# Patient Record
Sex: Female | Born: 1935 | Hispanic: No | Marital: Married | Smoking: Never smoker
Health system: Southern US, Community
[De-identification: ages and names within clinical notes are randomized; demographics above are authoritative.]

## PROBLEM LIST (undated history)

## (undated) DIAGNOSIS — E079 Disorder of thyroid, unspecified: Secondary | ICD-10-CM

## (undated) HISTORY — PX: THYROIDECTOMY, PARTIAL: SHX18

---

## 2003-04-25 ENCOUNTER — Other Ambulatory Visit: Admission: RE | Admit: 2003-04-25 | Discharge: 2003-04-25 | Payer: Self-pay | Admitting: Family Medicine

## 2014-07-16 ENCOUNTER — Emergency Department (HOSPITAL_COMMUNITY): Payer: Medicare Other

## 2014-07-16 ENCOUNTER — Emergency Department (HOSPITAL_COMMUNITY)
Admission: EM | Admit: 2014-07-16 | Discharge: 2014-07-16 | Disposition: A | Discharge status: Home / Self Care | Payer: Medicare Other | Attending: Emergency Medicine | Admitting: Emergency Medicine

## 2014-07-16 ENCOUNTER — Encounter (HOSPITAL_COMMUNITY): Payer: Self-pay | Admitting: Emergency Medicine

## 2014-07-16 DIAGNOSIS — W19XXXA Unspecified fall, initial encounter: Secondary | ICD-10-CM

## 2014-07-16 DIAGNOSIS — S0993XA Unspecified injury of face, initial encounter: Secondary | ICD-10-CM

## 2014-07-16 DIAGNOSIS — Z880 Allergy status to penicillin: Secondary | ICD-10-CM | POA: Diagnosis not present

## 2014-07-16 DIAGNOSIS — S80212A Abrasion, left knee, initial encounter: Secondary | ICD-10-CM | POA: Diagnosis not present

## 2014-07-16 DIAGNOSIS — Z79899 Other long term (current) drug therapy: Secondary | ICD-10-CM | POA: Insufficient documentation

## 2014-07-16 DIAGNOSIS — Y9389 Activity, other specified: Secondary | ICD-10-CM | POA: Diagnosis not present

## 2014-07-16 DIAGNOSIS — Y998 Other external cause status: Secondary | ICD-10-CM | POA: Insufficient documentation

## 2014-07-16 DIAGNOSIS — E079 Disorder of thyroid, unspecified: Secondary | ICD-10-CM | POA: Insufficient documentation

## 2014-07-16 DIAGNOSIS — S01511A Laceration without foreign body of lip, initial encounter: Secondary | ICD-10-CM | POA: Diagnosis not present

## 2014-07-16 DIAGNOSIS — W010XXA Fall on same level from slipping, tripping and stumbling without subsequent striking against object, initial encounter: Secondary | ICD-10-CM | POA: Insufficient documentation

## 2014-07-16 DIAGNOSIS — Y92093 Driveway of other non-institutional residence as the place of occurrence of the external cause: Secondary | ICD-10-CM | POA: Diagnosis not present

## 2014-07-16 HISTORY — DX: Disorder of thyroid, unspecified: E07.9

## 2014-07-16 MED ORDER — BACITRACIN 500 UNIT/GM EX OINT
1.0000 "application " | TOPICAL_OINTMENT | Freq: Two times a day (BID) | CUTANEOUS | Status: DC
  Filled 2014-07-16: qty 0.9

## 2014-07-16 NOTE — Discharge Instructions (Signed)
Contusion Apply Ice to swelling. Take tylenol for pain.  A contusion is a deep bruise. Contusions are the result of an injury that caused bleeding under the skin. The contusion may turn blue, purple, or yellow. Minor injuries will give you a painless contusion, but more severe contusions may stay painful and swollen for a few weeks.  CAUSES  A contusion is usually caused by a blow, trauma, or direct force to an area of the body. SYMPTOMS   Swelling and redness of the injured area.  Bruising of the injured area.  Tenderness and soreness of the injured area.  Pain. DIAGNOSIS  The diagnosis can be made by taking a history and physical exam. An X-ray, CT scan, or MRI may be needed to determine if there were any associated injuries, such as fractures. TREATMENT  Specific treatment will depend on what area of the body was injured. In general, the best treatment for a contusion is resting, icing, elevating, and applying cold compresses to the injured area. Over-the-counter medicines may also be recommended for pain control. Ask your caregiver what the best treatment is for your contusion. HOME CARE INSTRUCTIONS   Put ice on the injured area.  Put ice in a plastic bag.  Place a towel between your skin and the bag.  Leave the ice on for 15-20 minutes, 3-4 times a day, or as directed by your health care provider.  Only take over-the-counter or prescription medicines for pain, discomfort, or fever as directed by your caregiver. Your caregiver may recommend avoiding anti-inflammatory medicines (aspirin, ibuprofen, and naproxen) for 48 hours because these medicines may increase bruising.  Rest the injured area.  If possible, elevate the injured area to reduce swelling. SEEK IMMEDIATE MEDICAL CARE IF:   You have increased bruising or swelling.  You have pain that is getting worse.  Your swelling or pain is not relieved with medicines. MAKE SURE YOU:   Understand these  instructions.  Will watch your condition.  Will get help right away if you are not doing well or get worse. Document Released: 12/15/2004 Document Revised: 03/12/2013 Document Reviewed: 01/10/2011 [facility] Patient Information 2015 [place], [place]. This information is not intended to replace advice given to you by your health care provider. Make sure you discuss any questions you have with your health care provider.

## 2014-07-16 NOTE — ED Provider Notes (Signed)
CSN: [id number]     Arrival date & time 07/16/14  1434 History   First MD Initiated Contact with Patient 07/16/14 1617     Chief Complaint  Patient presents with  . Fall     (Consider location/radiation/quality/duration/timing/severity/associated sxs/prior Treatment) Patient is a 79 y.o. female presenting with fall. The history is provided by the patient. No language interpreter was used.  Fall Pertinent negatives include no neck pain.  Ms. Terri Ballard is a 79 y.o female with a history of thyroid disease who presents after trip and fall in the driveway 1 hour ago. She states the driveway is uneven.  She landed on the left side of her face. She denied any loss of consciousness.  She was able to get up and ambulate. She states she is in no pain but she was worried because her face is swollen.  She has had no treatment prior to arrival.  She denies being on aspirin or blood thinners.  She denies a headache, vision changes, chest pain, shortness of breath, abdominal pain, or extremity pain.   Past Medical History  Diagnosis Date  . Thyroid disease    History reviewed. No pertinent past surgical history. History reviewed. No pertinent family history. History  Substance Use Topics  . Smoking status: Never Smoker   . Smokeless tobacco: Not on file  . Alcohol Use: No   OB History    No data available     Review of Systems  HENT: Positive for facial swelling.   Eyes: Negative for photophobia, redness and visual disturbance.  Musculoskeletal: Negative for neck pain.  Neurological: Negative for syncope.      Allergies  Ciprofloxacin; Doxycycline; Keflex; and Penicillins  Home Medications   Prior to Admission medications   Medication Sig Start Date End Date Taking? Authorizing Provider  Calcium Carbonate-Vitamin D (CALCIUM + D PO) Take 1 tablet by mouth daily.   Yes Historical Provider, MD  SYNTHROID 50 MCG tablet Take 50 mcg by mouth every morning. 05/11/14  Yes Historical Provider,  MD   BP 123/79 mmHg  Pulse 71  Temp(Src) 98.2 F (36.8 C) (Oral)  Resp 16  SpO2 98% Physical Exam  Constitutional: She is oriented to person, place, and time. She appears well-developed and well-nourished.  HENT:  Head: Normocephalic.  Significant edema and ecchymosis below the left eye.   Small 1/2cm laceration below the left sided lower vermillion border.   She has ecchymosis of the lower lip.   No broken teeth or dental pain.   Eyes: Conjunctivae are normal.  Neck: Normal range of motion. Neck supple.  Cardiovascular: Normal rate, regular rhythm and normal heart sounds.   Pulmonary/Chest: Effort normal and breath sounds normal.  Abdominal: Soft. There is no tenderness.  Musculoskeletal: Normal range of motion.  Left knee abrasion. Full ROM.  Patient ambulating while I am speaking with her.   Neurological: She is alert and oriented to person, place, and time.  Skin: Skin is warm and dry.  Nursing note and vitals reviewed.   ED Course  Procedures (including critical care time) Labs Review Labs Reviewed - No data to display  Imaging Review Ct Maxillofacial Wo Cm  07/16/2014   CLINICAL DATA:  Fall today. Multiple facial abrasions. Left zygoma region swelling.  EXAM: CT MAXILLOFACIAL WITHOUT CONTRAST  TECHNIQUE: Multidetector CT imaging of the maxillofacial structures was performed. Multiplanar CT image reconstructions were also generated. A small metallic BB was placed on the right temple in order to reliably differentiate right from  left.  COMPARISON:  Head CT, 07/19/2007  FINDINGS: No fracture. Sinuses are clear. Soft tissue attenuation lies the right mastoid bone, stable from the prior CT. There changes from a left mastoidectomy, also stable.  There is left cheek soft tissue edema and with a small hematoma.  Normal globes and orbits. Structures of the skullbase are unremarkable. No soft tissue masses or adenopathy.  IMPRESSION: 1. No fracture. 2. Left cheek soft tissue edema and  small hematoma. No other acute finding.   Electronically Signed   By: Amie Portland M.D.   On: 07/16/2014 17:10     EKG Interpretation None      MDM   Final diagnoses:  Fall, initial encounter  Facial injury, initial encounter  Patient presents for trip and fall in the driveway injuring the left side of her face.  He has no headache, no vision changes, no loss of consciousness.  She appears stable and is pacing in the ED with her husband. Her vitals are not concerning.   CT maxillofacial is negative for a fracture and the orbits appear normal.  She does have some soft tissue edema and a small hematoma. I have given her follow up.  She is to apply ice for swelling and I did tell her that she would most likely have a black eye in a few hours. She cane take tylenol for pain.  I also gave her bacitracin to apply below the lip and on the left knee abrasion.       Catha Gosselin, PA-C 07/16/14 1747  Nelva Nay, MD 07/17/14 [phone]

## 2014-07-16 NOTE — ED Notes (Signed)
Pt tripped and fell earlier today. Complains of L side facial, elbow and knee pain. Abrasions noted to side of face, elbow and knee. Full ROM. Not on blood thinners, no LOC.

## 2018-04-19 ENCOUNTER — Encounter (HOSPITAL_COMMUNITY): Payer: Self-pay

## 2018-04-19 ENCOUNTER — Emergency Department (HOSPITAL_COMMUNITY): Payer: Medicare Other

## 2018-04-19 ENCOUNTER — Emergency Department (HOSPITAL_COMMUNITY)
Admission: EM | Admit: 2018-04-19 | Discharge: 2018-04-19 | Disposition: A | Discharge status: Home / Self Care | Payer: Medicare Other | Attending: Emergency Medicine | Admitting: Emergency Medicine

## 2018-04-19 ENCOUNTER — Other Ambulatory Visit: Payer: Self-pay

## 2018-04-19 DIAGNOSIS — S42291A Other displaced fracture of upper end of right humerus, initial encounter for closed fracture: Secondary | ICD-10-CM | POA: Diagnosis not present

## 2018-04-19 DIAGNOSIS — Y929 Unspecified place or not applicable: Secondary | ICD-10-CM | POA: Diagnosis not present

## 2018-04-19 DIAGNOSIS — S43084A Other dislocation of right shoulder joint, initial encounter: Secondary | ICD-10-CM | POA: Diagnosis not present

## 2018-04-19 DIAGNOSIS — S4991XA Unspecified injury of right shoulder and upper arm, initial encounter: Secondary | ICD-10-CM | POA: Diagnosis present

## 2018-04-19 DIAGNOSIS — W010XXA Fall on same level from slipping, tripping and stumbling without subsequent striking against object, initial encounter: Secondary | ICD-10-CM | POA: Diagnosis not present

## 2018-04-19 DIAGNOSIS — Z79899 Other long term (current) drug therapy: Secondary | ICD-10-CM | POA: Diagnosis not present

## 2018-04-19 DIAGNOSIS — Y999 Unspecified external cause status: Secondary | ICD-10-CM | POA: Diagnosis not present

## 2018-04-19 DIAGNOSIS — Y939 Activity, unspecified: Secondary | ICD-10-CM | POA: Diagnosis not present

## 2018-04-19 DIAGNOSIS — S43004A Unspecified dislocation of right shoulder joint, initial encounter: Secondary | ICD-10-CM

## 2018-04-19 MED ORDER — SODIUM CHLORIDE 0.9 % IV BOLUS
1000.0000 mL | Freq: Once | INTRAVENOUS | Status: AC
  Administered 2018-04-19: 1000 mL via INTRAVENOUS

## 2018-04-19 MED ORDER — FENTANYL CITRATE (PF) 100 MCG/2ML IJ SOLN
50.0000 ug | Freq: Once | INTRAMUSCULAR | Status: AC
  Administered 2018-04-19: 50 ug via INTRAVENOUS
  Filled 2018-04-19: qty 2

## 2018-04-19 MED ORDER — PROPOFOL 10 MG/ML IV BOLUS
0.5000 mg/kg | Freq: Once | INTRAVENOUS | Status: AC
  Administered 2018-04-19: 29.1 mg via INTRAVENOUS
  Filled 2018-04-19: qty 20

## 2018-04-19 MED ORDER — ONDANSETRON 4 MG PO TBDP
4.0000 mg | ORAL_TABLET | Freq: Once | ORAL | Status: DC | PRN
  Filled 2018-04-19 (×2): qty 1

## 2018-04-19 MED ORDER — IBUPROFEN 200 MG PO TABS
400.0000 mg | ORAL_TABLET | Freq: Once | ORAL | Status: AC
  Administered 2018-04-19: 400 mg via ORAL
  Filled 2018-04-19: qty 2

## 2018-04-19 MED ORDER — PROPOFOL 10 MG/ML IV BOLUS
INTRAVENOUS | Status: AC | PRN
  Administered 2018-04-19: 30 mg via INTRAVENOUS
  Administered 2018-04-19 (×2): 20 mg via INTRAVENOUS

## 2018-04-19 MED ORDER — PROPOFOL 10 MG/ML IV BOLUS
INTRAVENOUS | Status: AC | PRN
  Administered 2018-04-19: 30 mg via INTRAVENOUS

## 2018-04-19 NOTE — ED Provider Notes (Signed)
[facility]-EMERGENCY DEPT Provider Note   CSN: [id number] Arrival date & time: 04/19/18  1511     History   Chief Complaint Chief Complaint  Patient presents with  . Fall    HPI Terri Ballard is a 83 y.o. female.  HPI  83 year old female presents after a fall.  She slipped while wearing socks.  Landed on her right arm and is having pain at the right shoulder.  No weakness or numbness.  She did not hit her head, lose consciousness and denies a headache.  No neck pain.  There is no pain besides her right upper extremity.  She has not taken anything for it.  She had a hard time getting up due to the pain in her arm.  Past Medical History:  Diagnosis Date  . Thyroid disease     There are no active problems to display for this patient.   Past Surgical History:  Procedure Laterality Date  . THYROIDECTOMY, PARTIAL       OB History   No obstetric history on file.      Home Medications    Prior to Admission medications   Medication Sig Start Date End Date Taking? Authorizing Provider  Calcium-Magnesium 100-50 MG TABS Take 1 tablet by mouth daily.   Yes [provider]  cholecalciferol (VITAMIN D3) 25 MCG (1000 UT) tablet Take 1,000 Units by mouth daily.   Yes [provider]  hydrochlorothiazide (HYDRODIURIL) 12.5 MG tablet Take 12.5 mg by mouth daily.  04/09/18  Yes [provider]  SYNTHROID 50 MCG tablet Take 50 mcg by mouth every morning. 05/11/14  Yes [provider]    Family History No family history on file.  Social History Social History   Tobacco Use  . Smoking status: Never Smoker  . Smokeless tobacco: Never Used  Substance Use Topics  . Alcohol use: No  . Drug use: No     Allergies   Ciprofloxacin; Doxycycline; Keflex [cephalexin]; Lisinopril; and Penicillins   Review of Systems Review of Systems  Gastrointestinal: Positive for nausea. Negative for vomiting.  Musculoskeletal: Positive for  arthralgias. Negative for neck pain.  Neurological: Negative for syncope, weakness, numbness and headaches.     Physical Exam Updated Vital Signs BP (!) 164/106   Pulse 77   Temp 97.6 F (36.4 C) (Oral)   Resp (!) 21   Ht 5\' 2"  (1.575 m)   Wt 58.1 kg   SpO2 100%   BMI 23.41 kg/m   Physical Exam Vitals signs and nursing note reviewed.  Constitutional:      General: She is not in acute distress.    Appearance: She is well-developed. She is not ill-appearing.  HENT:     Head: Normocephalic and atraumatic.     Right Ear: External ear normal.     Left Ear: External ear normal.     Nose: Nose normal.  Eyes:     General:        Right eye: No discharge.        Left eye: No discharge.  Neck:     Musculoskeletal: No spinous process tenderness or muscular tenderness.  Cardiovascular:     Rate and Rhythm: Normal rate and regular rhythm.  Pulmonary:     Effort: Pulmonary effort is normal.  Abdominal:     Palpations: Abdomen is soft.  Musculoskeletal:     Right shoulder: She exhibits decreased range of motion, tenderness and deformity. She exhibits no laceration.  Right elbow: No tenderness found.     Right wrist: She exhibits normal range of motion, no tenderness and no swelling.     Right forearm: She exhibits no tenderness.     Right hand: She exhibits normal range of motion, no tenderness, no deformity and no swelling.     Comments: Normal strength/sensation in RUE.  Skin:    General: Skin is warm and dry.  Neurological:     Mental Status: She is alert.  Psychiatric:        Mood and Affect: Mood is not anxious.      ED Treatments / Results  Labs (all labs ordered are listed, but only abnormal results are displayed) Labs Reviewed - No data to display  EKG None  Radiology Dg Shoulder Right  Result Date: 04/19/2018 CLINICAL DATA:  Initial evaluation for acute trauma, fall. EXAM: RIGHT SHOULDER - 2+ VIEW COMPARISON:  None. FINDINGS: Right humeral head  dislocated anteriorly and inferiorly relative to the glenoid. Associated comminuted fracture fragments at the posterolateral aspect of the right humeral head, likely arising from the humeral head itself. Glenoid grossly intact. AC joint approximated. Osteoarthritic changes noted about the right AC joint. Visualized right hemithorax clear. No appreciable soft tissue injury. IMPRESSION: Acute anterior inferior dislocation of the right glenohumeral joint with associated comminuted fracture. Electronically Signed   By: Rise Mu M.D.   On: 04/19/2018 16:04   Dg Wrist Complete Right  Result Date: 04/19/2018 CLINICAL DATA:  Initial evaluation for acute trauma, fall.  No acute EXAM: RIGHT WRIST - COMPLETE 3+ VIEW COMPARISON:  None. FINDINGS: Fracture or dislocation. No acute soft tissue injury. Bone mineralization normal. Mild scattered osteoarthritic changes about the hand and wrist. IMPRESSION: No acute osseous abnormality about the right wrist. Electronically Signed   By: Rise Mu M.D.   On: 04/19/2018 16:02   Dg Shoulder Right Port  Result Date: 04/19/2018 CLINICAL DATA:  Reduction of RIGHT humeral head fracture dislocation. EXAM: PORTABLE RIGHT SHOULDER COMPARISON:  04/19/2018 FINDINGS: Relocation of the humeral head noted. Humeral head fracture is again noted but slightly less evident. No other significant abnormalities identified. IMPRESSION: Relocation of the humeral head with redemonstration of humeral head fracture. Electronically Signed   By: Harmon Pier M.D.   On: 04/19/2018 18:25   Dg Hand Complete Right  Result Date: 04/19/2018 CLINICAL DATA:  Initial evaluation for acute trauma, fall. EXAM: RIGHT HAND - COMPLETE 3+ VIEW COMPARISON:  None. FINDINGS: No acute fracture or dislocation. No acute soft tissue injury. Mild scattered osteoarthritic changes throughout the hand. No discrete osseous lesions. IMPRESSION: No acute osseous abnormality about the right hand. Electronically  Signed   By: Rise Mu M.D.   On: 04/19/2018 16:00    Procedures .Sedation Date/Time: 04/19/2018 5:57 PM Performed by: Pricilla Loveless, MD Authorized by: Pricilla Loveless, MD   Consent:    Consent obtained:  Verbal   Consent given by:  Patient   Risks discussed:  Allergic reaction, dysrhythmia, inadequate sedation, nausea, prolonged hypoxia resulting in organ damage, prolonged sedation necessitating reversal, respiratory compromise necessitating ventilatory assistance and intubation and vomiting Universal protocol:    Procedure explained and questions answered to patient or proxy's satisfaction: yes     Relevant documents present and verified: yes     Test results available and properly labeled: yes     Imaging studies available: yes     Required blood products, implants, devices, and special equipment available: yes     Site/side marked: yes  Immediately prior to procedure a time out was called: yes     Patient identity confirmation method:  Verbally with patient Indications:    Procedure performed:  Dislocation reduction   Procedure necessitating sedation performed by:  Physician performing sedation Pre-sedation assessment:    Time since last food or drink:  4 hours   ASA classification: class 2 - patient with mild systemic disease     Neck mobility: normal     Mouth opening:  3 or more finger widths   Thyromental distance:  4 finger widths   Mallampati score:  I - soft palate, uvula, fauces, pillars visible   Pre-sedation assessments completed and reviewed: airway patency, cardiovascular function, hydration status, mental status, nausea/vomiting, pain level, respiratory function and temperature   Immediate pre-procedure details:    Reassessment: Patient reassessed immediately prior to procedure     Reviewed: vital signs, relevant labs/tests and NPO status     Verified: bag valve mask available, emergency equipment available, intubation equipment available, IV  patency confirmed, oxygen available and suction available   Procedure details (see MAR for exact dosages):    Preoxygenation:  Nasal cannula   Sedation:  Propofol   Intra-procedure monitoring:  Blood pressure monitoring, cardiac monitor, continuous pulse oximetry, frequent LOC assessments, frequent vital sign checks and continuous capnometry   Intra-procedure events: none     Total Provider sedation time (minutes):  13 Post-procedure details:    Attendance: Constant attendance by certified staff until patient recovered     Recovery: Patient returned to pre-procedure baseline     Post-sedation assessments completed and reviewed: airway patency, cardiovascular function, hydration status, mental status, nausea/vomiting, pain level, respiratory function and temperature     Patient is stable for discharge or admission: yes     Patient tolerance:  Tolerated well, no immediate complications Reduction of dislocation Date/Time: 04/19/2018 5:57 PM Performed by: Pricilla Loveless, MD Authorized by: Pricilla Loveless, MD  Consent: Verbal consent obtained. Written consent obtained. Risks and benefits: risks, benefits and alternatives were discussed Consent given by: patient and spouse Patient understanding: patient states understanding of the procedure being performed Patient consent: the patient's understanding of the procedure matches consent given Procedure consent: procedure consent matches procedure scheduled Relevant documents: relevant documents present and verified Test results: test results available and properly labeled Site marked: the operative site was marked Imaging studies: imaging studies available Required items: required blood products, implants, devices, and special equipment available Patient identity confirmed: verbally with patient Time out: Immediately prior to procedure a "time out" was called to verify the correct patient, procedure, equipment, support staff and site/side marked  as required. Preparation: Patient was prepped and draped in the usual sterile fashion. Local anesthesia used: no  Anesthesia: Local anesthesia used: no  Sedation: Patient sedated: yes  Patient tolerance: Patient tolerated the procedure well with no immediate complications Comments: Initially tried FARES without sedation without relief.  Then tried Milch while under sedation without relief.  Finally external rotation reduce the dislocation.    (including critical care time)  Medications Ordered in ED Medications  ondansetron (ZOFRAN-ODT) disintegrating tablet 4 mg (4 mg Oral Refused 04/19/18 1533)  ibuprofen (ADVIL,MOTRIN) tablet 400 mg (400 mg Oral Given 04/19/18 1627)  propofol (DIPRIVAN) 10 mg/mL bolus/IV push 29.1 mg (29.1 mg Intravenous Given 04/19/18 1818)  fentaNYL (SUBLIMAZE) injection 50 mcg (50 mcg Intravenous Given 04/19/18 1721)  sodium chloride 0.9 % bolus 1,000 mL (0 mLs Intravenous Stopped 04/19/18 1822)  propofol (DIPRIVAN) 10 mg/mL bolus/IV push (30  mg Intravenous Given 04/19/18 1741)  propofol (DIPRIVAN) 10 mg/mL bolus/IV push (20 mg Intravenous Given 04/19/18 1746)     Initial Impression / Assessment and Plan / ED Course  I have reviewed the triage vital signs and the nursing notes.  Pertinent labs & imaging results that were available during my care of the patient were reviewed by me and considered in my medical decision making (see chart for details).     Patient presents with a mechanical fall.  She is neurovascular intact and has a shoulder dislocation with small fracture.  I discussed with Dr. Roda ShuttersXu, who has reviewed the x-rays and feels that this should not hinder reduction.  Patient ideally did not want any meds at all because she states she is very sensitive to medicines.  Tried FARES as above but her pain and spasm became too much for reduction.  Thus she was sedated as above and now has good reduction.  Neurovascular intact after procedure.  No head injury or loss  of consciousness.  She appears stable for outpatient follow-up with orthopedics.  Final Clinical Impressions(s) / ED Diagnoses   Final diagnoses:  Dislocation of right shoulder joint, initial encounter  Closed fracture of head of right humerus, initial encounter    ED Discharge Orders    None       Pricilla LovelessGoldston, Mayme Profeta, MD 04/19/18 1858

## 2018-04-19 NOTE — ED Notes (Signed)
Patent comfortable, relaxed and conversing with visitors at bedside.

## 2018-04-19 NOTE — ED Triage Notes (Signed)
Patient states she was running with socks on and slipped and fell. Patient c/o right shoulder arm, and right wrist pain.

## 2018-04-19 NOTE — ED Notes (Signed)
Patient having dry heaves in triage. Zofran offered and patient refused because she was afraid the insurance would not pay for it.

## 2018-04-19 NOTE — Discharge Instructions (Signed)
If you develop weakness or numbness in your arm, severe unrelenting pain, or any other new/concerning symptoms then return to the ER for evaluation.  Call the orthopedist office tomorrow, 1/31, for an appointment next week.

## 2018-04-19 NOTE — ED Notes (Signed)
Ortho called for shoulder sling.

## 2018-04-19 NOTE — ED Notes (Signed)
Patient refused ice for shoulder at this time.

## 2018-04-24 ENCOUNTER — Encounter (INDEPENDENT_AMBULATORY_CARE_PROVIDER_SITE_OTHER): Payer: Self-pay | Admitting: Orthopaedic Surgery

## 2018-04-24 ENCOUNTER — Ambulatory Visit (INDEPENDENT_AMBULATORY_CARE_PROVIDER_SITE_OTHER): Payer: Medicare Other | Admitting: Orthopaedic Surgery

## 2018-04-24 ENCOUNTER — Ambulatory Visit (INDEPENDENT_AMBULATORY_CARE_PROVIDER_SITE_OTHER): Payer: Medicare Other

## 2018-04-24 VITALS — Ht 62.0 in | Wt 128.0 lb

## 2018-04-24 DIAGNOSIS — W19XXXA Unspecified fall, initial encounter: Secondary | ICD-10-CM

## 2018-04-24 DIAGNOSIS — S4291XA Fracture of right shoulder girdle, part unspecified, initial encounter for closed fracture: Secondary | ICD-10-CM

## 2018-04-24 DIAGNOSIS — Y92009 Unspecified place in unspecified non-institutional (private) residence as the place of occurrence of the external cause: Secondary | ICD-10-CM

## 2018-04-24 NOTE — Progress Notes (Signed)
Office Visit Note   Patient: Terri Ballard           Date of Birth: March 08, 1936           MRN: [id number] Visit Date: 04/24/2018              Requested by: Chilton GreathouseAvva, Ravisankar, MD [address] [place], [place] [id number] PCP: Chilton GreathouseAvva, Ravisankar, MD   Assessment & Plan: Visit Diagnoses:  1. Closed fracture dislocation of right shoulder joint, initial encounter     Plan: Impression is minimally displaced right greater tuberosity fracture dislocation of the shoulder.  We discussed the possibility of posttraumatic arthritis and stiffness.  Also we discussed possibility of rotator cuff tear although greater tuberosity fracture possibly decreases the chances of this.  I would like to see her back in a week for recheck with Grashey and scapular Y x-rays of the right shoulder.  Follow-Up Instructions: Return in about 1 week (around 05/01/2018).   Orders:  Orders Placed This Encounter  Procedures  . XR Shoulder Right   No orders of the defined types were placed in this encounter.     Procedures: No procedures performed   Clinical Data: No additional findings.   Subjective: Chief Complaint  Patient presents with  . Right Shoulder - Pain    S/p dislocation and reduction in ER 04/19/2018    Terri Ballard is a 83 year old right-hand-dominant female who sustained a right shoulder fracture dislocation of her greater tuberosity on 04/19/2018 from a mechanical fall at home.  She required a shoulder reduction in the ED under conscious sedation.  She was placed in a sling and she follows up today.  Her pain is minimal today.  Denies any numbness and tingling.  She does have bruising.   Review of Systems  Constitutional: Negative.   HENT: Negative.   Eyes: Negative.   Respiratory: Negative.   Cardiovascular: Negative.   Endocrine: Negative.   Musculoskeletal: Negative.   Neurological: Negative.   Hematological: Negative.   Psychiatric/Behavioral: Negative.   All other systems reviewed and are  negative.    Objective: Vital Signs: Ht 5\' 2"  (1.575 m)   Wt 128 lb (58.1 kg)   BMI 23.41 kg/m   Physical Exam Vitals signs and nursing note reviewed.  Constitutional:      Appearance: She is well-developed.  HENT:     Head: Normocephalic and atraumatic.  Neck:     Musculoskeletal: Neck supple.  Pulmonary:     Effort: Pulmonary effort is normal.  Abdominal:     Palpations: Abdomen is soft.  Skin:    General: Skin is warm.     Capillary Refill: Capillary refill takes less than 2 seconds.  Neurological:     Mental Status: She is alert and oriented to person, place, and time.  Psychiatric:        Behavior: Behavior normal.        Thought Content: Thought content normal.        Judgment: Judgment normal.     Ortho Exam Right shoulder exam shows swelling and bruising.  No neurovascular compromise.  Axillary nerve intact. Specialty Comments:  No specialty comments available.  Imaging: Xr Shoulder Right  Result Date: 04/24/2018 Minimally displaced greater tuberosity fracture.  Concentric shoulder joint.    PMFS History: There are no active problems to display for this patient.  Past Medical History:  Diagnosis Date  . Thyroid disease     No family history on file.  Past Surgical History:  Procedure Laterality  Date  . THYROIDECTOMY, PARTIAL     Social History   Occupational History  . Not on file  Tobacco Use  . Smoking status: Never Smoker  . Smokeless tobacco: Never Used  Substance and Sexual Activity  . Alcohol use: No  . Drug use: No  . Sexual activity: Not on file

## 2018-05-01 ENCOUNTER — Ambulatory Visit (INDEPENDENT_AMBULATORY_CARE_PROVIDER_SITE_OTHER): Payer: Medicare Other

## 2018-05-01 ENCOUNTER — Encounter (INDEPENDENT_AMBULATORY_CARE_PROVIDER_SITE_OTHER): Payer: Self-pay | Admitting: Orthopaedic Surgery

## 2018-05-01 ENCOUNTER — Ambulatory Visit (INDEPENDENT_AMBULATORY_CARE_PROVIDER_SITE_OTHER): Payer: Medicare Other | Admitting: Orthopaedic Surgery

## 2018-05-01 VITALS — Ht 62.0 in | Wt 128.0 lb

## 2018-05-01 DIAGNOSIS — M25511 Pain in right shoulder: Secondary | ICD-10-CM

## 2018-05-01 DIAGNOSIS — S4291XA Fracture of right shoulder girdle, part unspecified, initial encounter for closed fracture: Secondary | ICD-10-CM

## 2018-05-01 NOTE — Progress Notes (Signed)
   Post-Op Visit Note   Patient: Terri Ballard           Date of Birth: Jan 25, 1936           MRN: [id number] Visit Date: 05/01/2018 PCP: Chilton Greathouse, MD   Assessment & Plan:  Chief Complaint:  Chief Complaint  Patient presents with  . Right Shoulder - Pain, Follow-up   Visit Diagnoses:  1. Closed fracture dislocation of right shoulder joint, initial encounter   2. Right shoulder pain, unspecified chronicity     Plan: Patient is almost 2 weeks status post right greater tuberosity fracture dislocation of the shoulder.  She is overall doing well.  She is tolerating the sling.  Her swelling and bruising are improved.  Her x-rays demonstrate stable alignment of the greater tuberosity fracture.  We will continue with the shoulder immobilization for another week.  We will see her in a week with Grashey and scapular Y x-rays.  If things look good we will start some pendulum exercises at that time.  Follow-Up Instructions: Return in about 1 week (around 05/08/2018).   Orders:  Orders Placed This Encounter  Procedures  . XR Shoulder Right   No orders of the defined types were placed in this encounter.   Imaging: Xr Shoulder Right  Result Date: 05/01/2018 Stable greater tuberosity fracture and concentric shoulder reduction.   PMFS History: There are no active problems to display for this patient.  Past Medical History:  Diagnosis Date  . Thyroid disease     No family history on file.  Past Surgical History:  Procedure Laterality Date  . THYROIDECTOMY, PARTIAL     Social History   Occupational History  . Not on file  Tobacco Use  . Smoking status: Never Smoker  . Smokeless tobacco: Never Used  Substance and Sexual Activity  . Alcohol use: No  . Drug use: No  . Sexual activity: Not on file

## 2018-05-08 ENCOUNTER — Encounter (INDEPENDENT_AMBULATORY_CARE_PROVIDER_SITE_OTHER): Payer: Self-pay | Admitting: Orthopaedic Surgery

## 2018-05-08 ENCOUNTER — Ambulatory Visit (INDEPENDENT_AMBULATORY_CARE_PROVIDER_SITE_OTHER): Payer: Medicare Other | Admitting: Orthopaedic Surgery

## 2018-05-08 ENCOUNTER — Ambulatory Visit (INDEPENDENT_AMBULATORY_CARE_PROVIDER_SITE_OTHER): Payer: Medicare Other

## 2018-05-08 VITALS — Ht 62.0 in | Wt 128.0 lb

## 2018-05-08 DIAGNOSIS — S4291XA Fracture of right shoulder girdle, part unspecified, initial encounter for closed fracture: Secondary | ICD-10-CM | POA: Insufficient documentation

## 2018-05-08 NOTE — Progress Notes (Signed)
   Office Visit Note   Patient: Terri Ballard           Date of Birth: [birth year]           MRN: [id number] Visit Date: 05/08/2018              Requested by: Chilton Greathouse, MD [address] [place], [place] [address] PCP: Chilton Greathouse, MD   Assessment & Plan: Visit Diagnoses:  1. Closed fracture dislocation of right shoulder joint, initial encounter     Plan: Impression is 3 weeks status post right shoulder dislocation and greater tuberosity fracture.  The patient can come out of her sling while at home.  She will still need to wear this in public.  She can start pendulum swings at this point.  A handout was provided to her for this.  Follow-up with [place] in 3 weeks time for repeat evaluation and x-rays to include Grashey and scapular Y.  Follow-Up Instructions: Return in about 3 weeks (around 05/29/2018).   Orders:  Orders Placed This Encounter  Procedures  . XR Shoulder Right   No orders of the defined types were placed in this encounter.     Procedures: No procedures performed   Clinical Data: No additional findings.   Subjective: Chief Complaint  Patient presents with  . Right Shoulder - Follow-up    HPI patient is a pleasant 82 year old female who presents our clinic today 3 weeks status post right shoulder dislocation and greater tuberosity fracture.  She has been compliant in her sling.  She does exhibit some soreness to the right deltoid, otherwise no pain.  Review of Systems as detailed in HPI.  All others reviewed and are negative.   Objective: Vital Signs: Ht 5\' 2"  (1.575 m)   Wt 128 lb (58.1 kg)   BMI 23.41 kg/m   Physical Exam well-developed well-nourished female no acute distress.  Alert and oriented x3.  Ortho Exam examination of the right shoulder reveals mild tenderness over the fracture site.  She does have moderate soreness throughout the deltoid.  Fingers are warm and well perfused.  She is neurovascularly intact distally.  Specialty  Comments:  No specialty comments available.  Imaging: Xr Shoulder Right  Result Date: 05/08/2018 X-rays of the right shoulder demonstrate stable alignment of the greater tuberosity fracture with bony ingrowth    PMFS History: Patient Active Problem List   Diagnosis Date Noted  . Closed fracture dislocation of right shoulder joint, initial encounter 05/08/2018   Past Medical History:  Diagnosis Date  . Thyroid disease     History reviewed. No pertinent family history.  Past Surgical History:  Procedure Laterality Date  . THYROIDECTOMY, PARTIAL     Social History   Occupational History  . Not on file  Tobacco Use  . Smoking status: Never Smoker  . Smokeless tobacco: Never Used  Substance and Sexual Activity  . Alcohol use: No  . Drug use: No  . Sexual activity: Not on file

## 2018-05-25 ENCOUNTER — Ambulatory Visit (INDEPENDENT_AMBULATORY_CARE_PROVIDER_SITE_OTHER): Payer: Medicare Other

## 2018-05-25 ENCOUNTER — Ambulatory Visit (INDEPENDENT_AMBULATORY_CARE_PROVIDER_SITE_OTHER): Payer: Medicare Other | Admitting: Orthopaedic Surgery

## 2018-05-25 ENCOUNTER — Encounter (INDEPENDENT_AMBULATORY_CARE_PROVIDER_SITE_OTHER): Payer: Self-pay | Admitting: Orthopaedic Surgery

## 2018-05-25 DIAGNOSIS — M25511 Pain in right shoulder: Secondary | ICD-10-CM

## 2018-05-25 DIAGNOSIS — S4291XD Fracture of right shoulder girdle, part unspecified, subsequent encounter for fracture with routine healing: Secondary | ICD-10-CM | POA: Diagnosis not present

## 2018-05-25 DIAGNOSIS — S4291XA Fracture of right shoulder girdle, part unspecified, initial encounter for closed fracture: Secondary | ICD-10-CM

## 2018-05-25 NOTE — Progress Notes (Signed)
   Office Visit Note   Patient: Terri Ballard           Date of Birth: [birth year]           MRN: [id number] Visit Date: 05/25/2018              Requested by: Chilton Greathouse, MD [address] [place], [place] [address] PCP: Chilton Greathouse, MD   Assessment & Plan: Visit Diagnoses:  1. Closed fracture dislocation of right shoulder joint, initial encounter   2. Acute pain of right shoulder     Plan: Impression is stable right greater tuberosity fracture and shoulder dislocation.  Overall the fracture appears to be healing okay.  We will begin outpatient PT starting next week.  We will recheck her in 4 weeks with Grashey and scapular Y x-rays on return.  Follow-Up Instructions: Return in about 4 weeks (around 06/22/2018).   Orders:  Orders Placed This Encounter  Procedures  . XR Shoulder Right   No orders of the defined types were placed in this encounter.     Procedures: No procedures performed   Clinical Data: No additional findings.   Subjective: Chief Complaint  Patient presents with  . Right Shoulder - Pain, Follow-up    Toleen is approximately 5 weeks status post right shoulder fracture dislocation.  She recently fell onto her right arm overall she is doing okay.  She is wearing the sling at the time.  She denies any new swelling or bruising or significant pain.   Review of Systems   Objective: Vital Signs: There were no vitals taken for this visit.  Physical Exam  Ortho Exam Right shoulder exam actually shows decent passive range of motion without significant pain.  No swelling or bruising.  Humerus is nontender. Specialty Comments:  No specialty comments available.  Imaging: Xr Shoulder Right  Result Date: 05/25/2018 Minimal superior migration of greater tuberosity fragment.  Fracture appears to be consolidating.    PMFS History: Patient Active Problem List   Diagnosis Date Noted  . Closed fracture dislocation of right shoulder joint, initial  encounter 05/08/2018   Past Medical History:  Diagnosis Date  . Thyroid disease     History reviewed. No pertinent family history.  Past Surgical History:  Procedure Laterality Date  . THYROIDECTOMY, PARTIAL     Social History   Occupational History  . Not on file  Tobacco Use  . Smoking status: Never Smoker  . Smokeless tobacco: Never Used  Substance and Sexual Activity  . Alcohol use: No  . Drug use: No  . Sexual activity: Not on file

## 2018-05-29 ENCOUNTER — Ambulatory Visit (INDEPENDENT_AMBULATORY_CARE_PROVIDER_SITE_OTHER): Payer: Medicare Other | Admitting: Orthopaedic Surgery

## 2018-05-29 ENCOUNTER — Telehealth (INDEPENDENT_AMBULATORY_CARE_PROVIDER_SITE_OTHER): Payer: Self-pay | Admitting: Orthopaedic Surgery

## 2018-05-29 NOTE — Telephone Encounter (Signed)
Terri Ballard from [facility] called requesting a referral PT and also a RX for the PT faxed over to them.  The fax number is [phone].  Her CB#[phone].  Thank you.

## 2018-05-30 NOTE — Telephone Encounter (Signed)
Faxed with demo sheet

## 2018-06-11 ENCOUNTER — Encounter (INDEPENDENT_AMBULATORY_CARE_PROVIDER_SITE_OTHER): Payer: Self-pay | Admitting: Orthopaedic Surgery

## 2018-07-03 ENCOUNTER — Ambulatory Visit (INDEPENDENT_AMBULATORY_CARE_PROVIDER_SITE_OTHER): Payer: Medicare Other | Admitting: Orthopaedic Surgery

## 2018-08-14 ENCOUNTER — Telehealth: Payer: Self-pay | Admitting: Orthopaedic Surgery

## 2018-08-14 NOTE — Telephone Encounter (Signed)
[facility] called in requesting a physical therapy script for this pt and would like that faxed to them  Fax#[phone] Tel# [phone]

## 2018-08-15 NOTE — Telephone Encounter (Signed)
FAXED

## 2018-08-28 ENCOUNTER — Telehealth: Payer: Self-pay

## 2018-08-28 NOTE — Telephone Encounter (Signed)
Kathlee Nations from [facility] [place] [facility] called and is needed plan of care for patient. Did this get faxed back?   [place] : [phone] [facility]: [phone]

## 2018-08-29 NOTE — Telephone Encounter (Signed)
IC spoke to Kathlee Nations and advised to re fax as was unable to locate

## 2019-04-12 ENCOUNTER — Ambulatory Visit: Payer: Medicare Other | Attending: Internal Medicine

## 2019-04-12 DIAGNOSIS — Z23 Encounter for immunization: Secondary | ICD-10-CM | POA: Insufficient documentation

## 2019-04-12 NOTE — Progress Notes (Signed)
   Covid-19 Vaccination Clinic  Name:  Terri Ballard    MRN: [id number] DOB: 27-Nov-1935  04/12/2019  [name] was observed post Covid-19 immunization for 15 minutes without incidence. She was provided with Vaccine Information Sheet and instruction to access the V-Safe system.   [name] was instructed to call 911 with any severe reactions post vaccine: Marland Kitchen Difficulty breathing  . Swelling of your face and throat  . A fast heartbeat  . A bad rash all over your body  . Dizziness and weakness    Immunizations Administered    Name Date Dose VIS Date Route   Pfizer COVID-19 Vaccine 04/12/2019  8:34 AM 0.3 mL 03/01/2019 Intramuscular   Manufacturer: [employer], [employer]   Lot: [id number]   NDC: [address]-6

## 2019-05-03 ENCOUNTER — Ambulatory Visit: Payer: Medicare Other | Attending: Internal Medicine

## 2019-05-03 DIAGNOSIS — Z23 Encounter for immunization: Secondary | ICD-10-CM | POA: Insufficient documentation

## 2019-05-03 NOTE — Progress Notes (Signed)
   Covid-19 Vaccination Clinic  Name:  Yared Susan    MRN: [id number] DOB: 05/30/1935  05/03/2019  Ms. Dewitt was observed post Covid-19 immunization for 30 minutes based on pre-vaccination screening without incidence. She was provided with Vaccine Information Sheet and instruction to access the V-Safe system.   Ms. Domingos was instructed to call 911 with any severe reactions post vaccine: Marland Kitchen Difficulty breathing  . Swelling of your face and throat  . A fast heartbeat  . A bad rash all over your body  . Dizziness and weakness    Immunizations Administered    Name Date Dose VIS Date Route   Pfizer COVID-19 Vaccine 05/03/2019  8:24 AM 0.3 mL 03/01/2019 Intramuscular   Manufacturer: [employer], [employer]   Lot: [id number]   NDC: [address]-4

## 2020-10-15 IMAGING — CR DG HAND COMPLETE 3+V*R*
3 series · 3 of 3 positions shown · non-contrast
Comparison: None.

CLINICAL DATA: Initial evaluation for acute trauma, fall.

EXAM:
RIGHT HAND - COMPLETE 3+ VIEW

[x hand pa right]
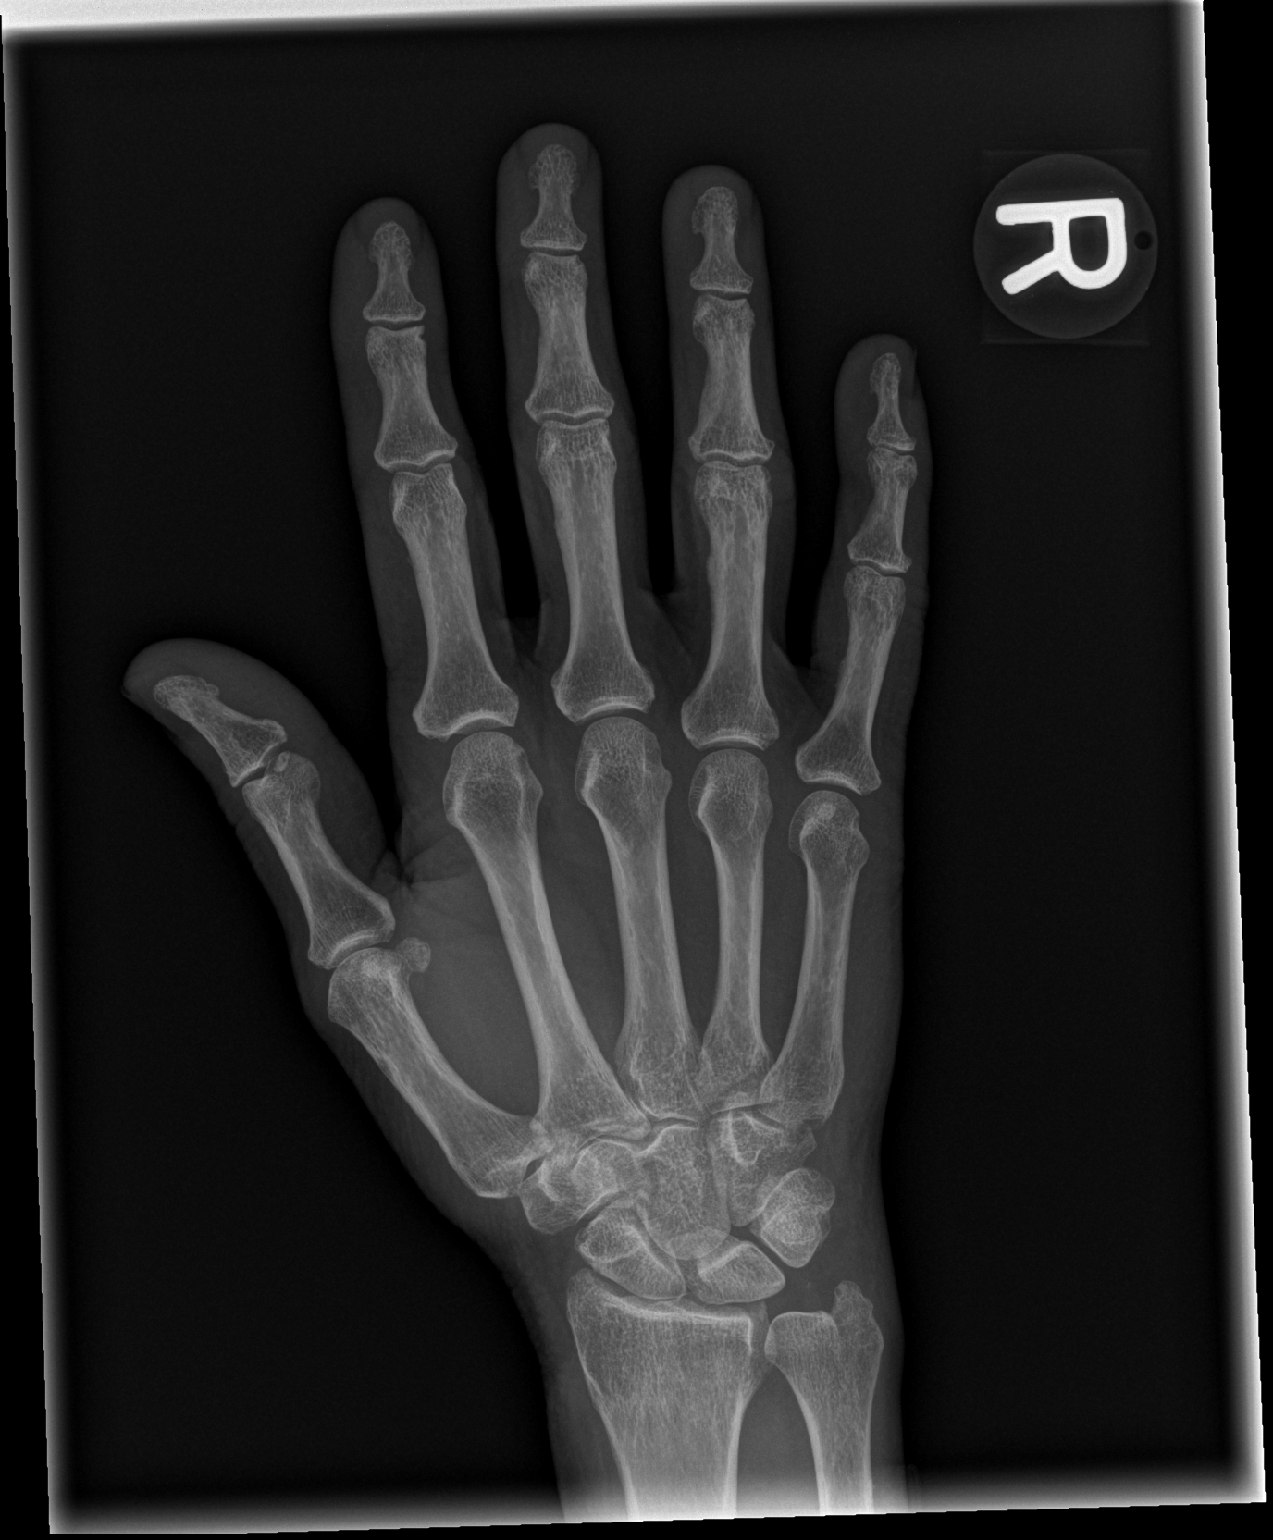

[x hand obl right]
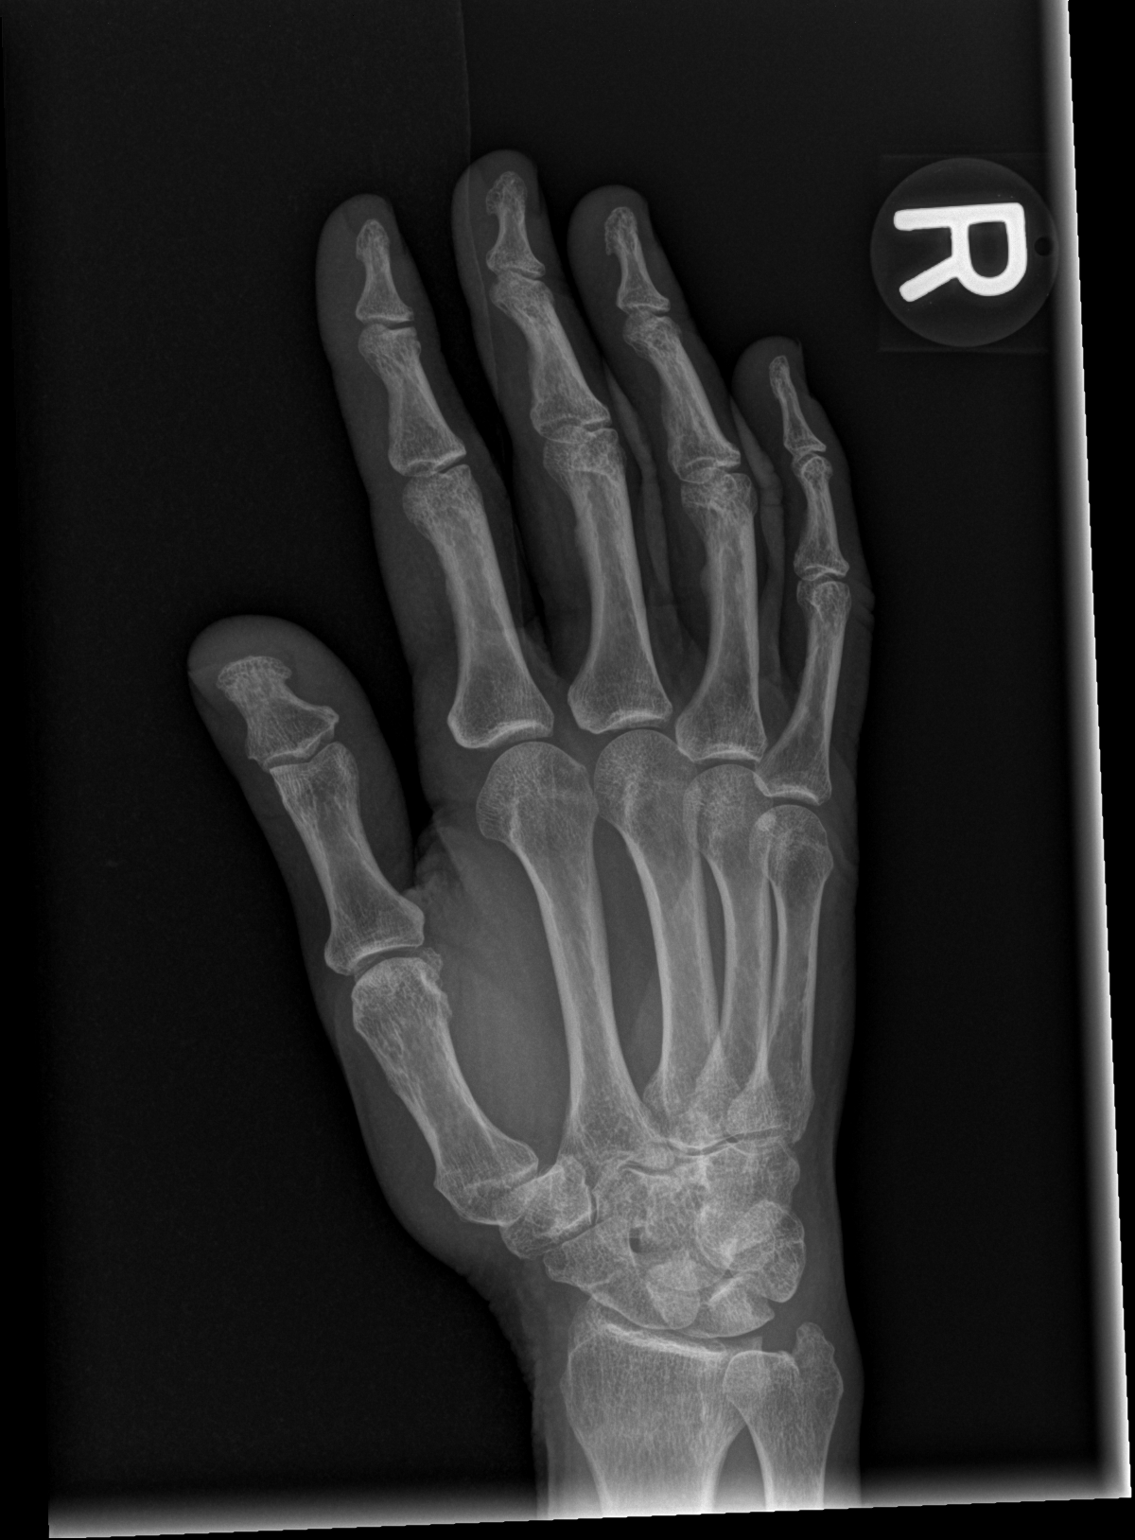

[x hand lat right]
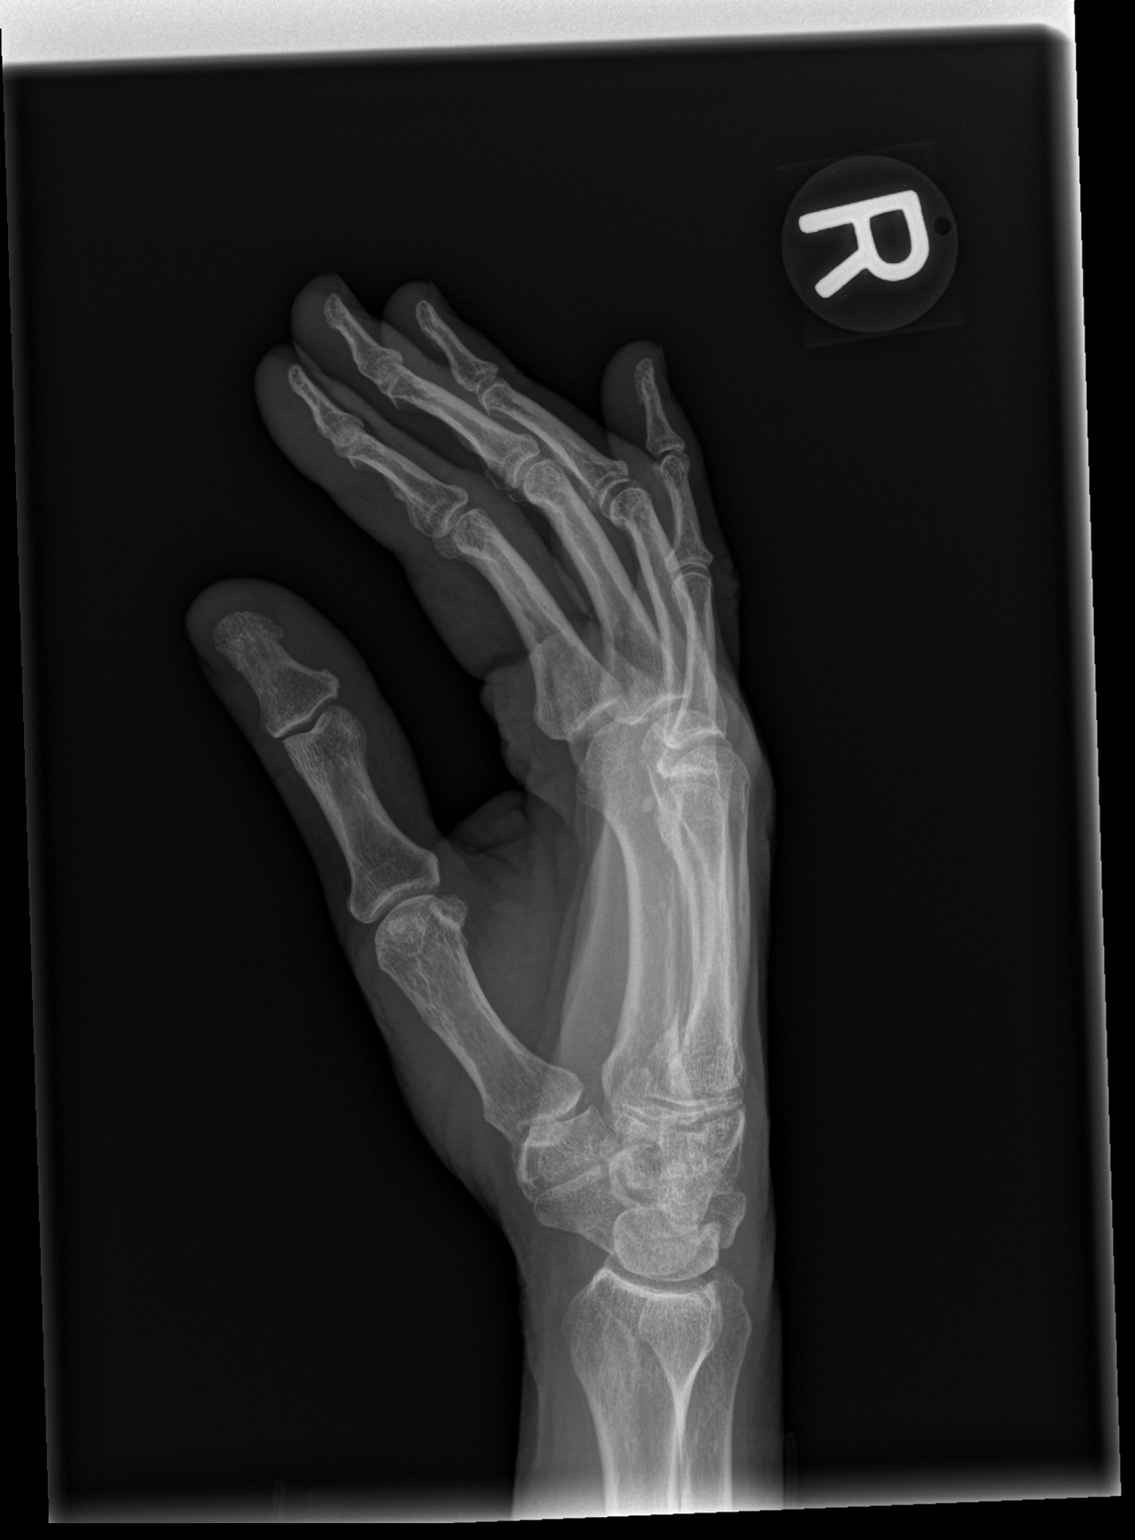

[3 of 3 positions shown; findings below may reference images not displayed]

FINDINGS: No acute fracture or dislocation. No acute soft tissue injury. Mild
scattered osteoarthritic changes throughout the hand. No discrete
osseous lesions.
IMPRESSION: No acute osseous abnormality about the right hand.

## 2020-10-15 IMAGING — CR DG SHOULDER 2+V*R*
2 series · 2 of 2 positions shown · non-contrast
Comparison: None.

CLINICAL DATA: Initial evaluation for acute trauma, fall.

EXAM:
RIGHT SHOULDER - 2+ VIEW

[w shoulder external right]
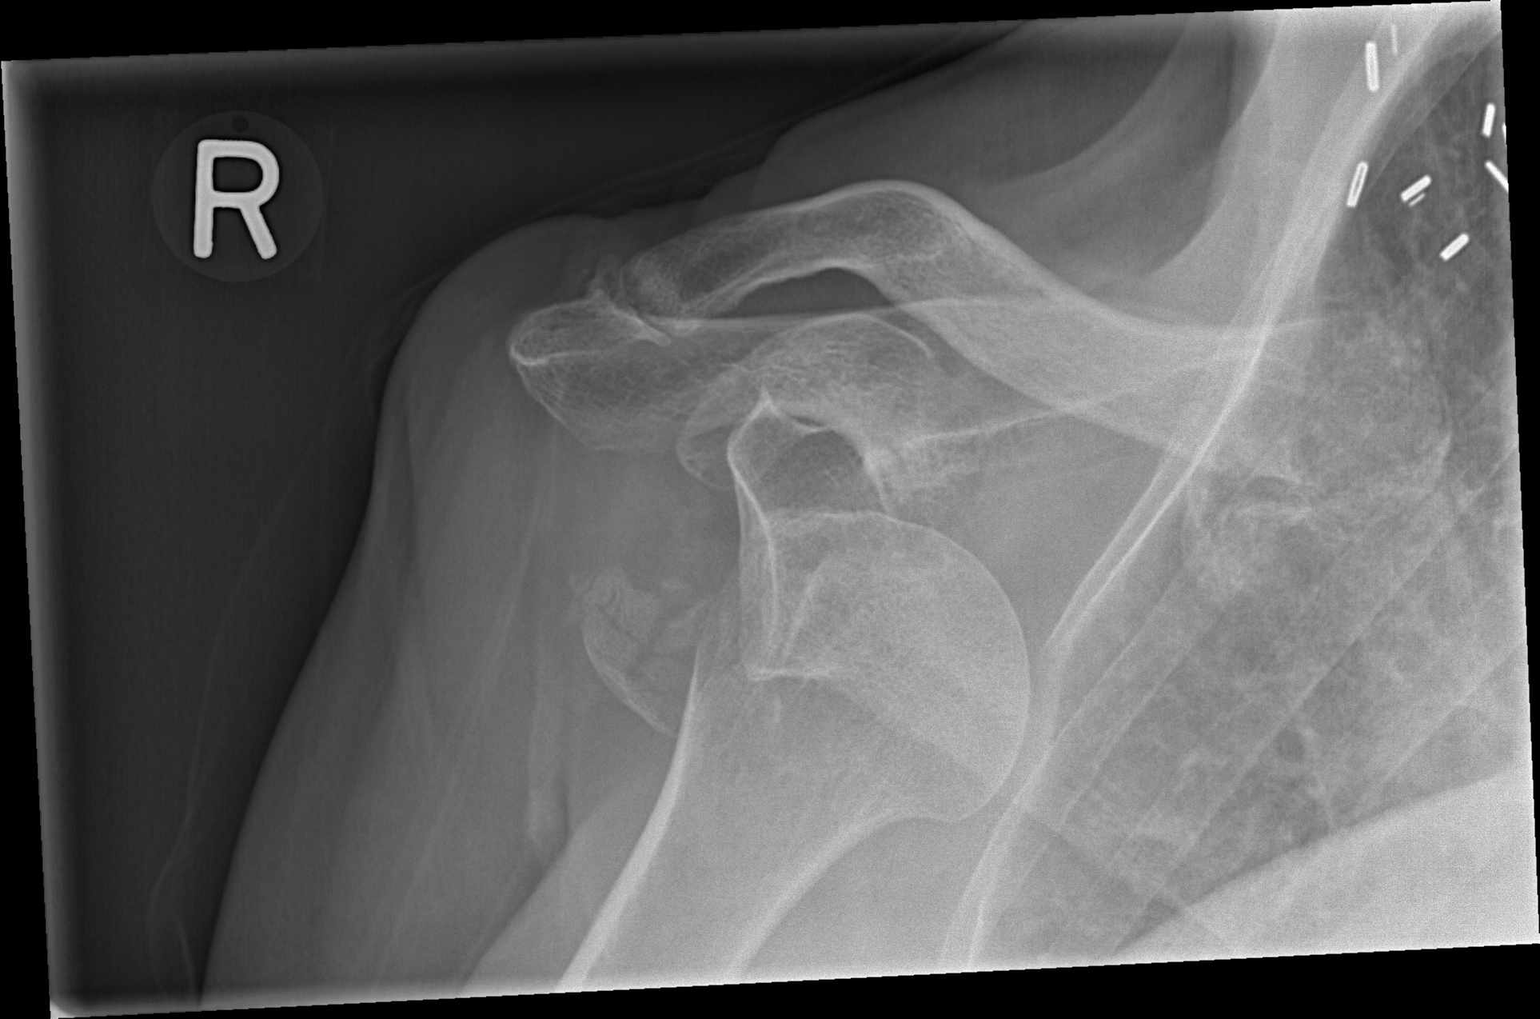

[w shoulder y-view right]
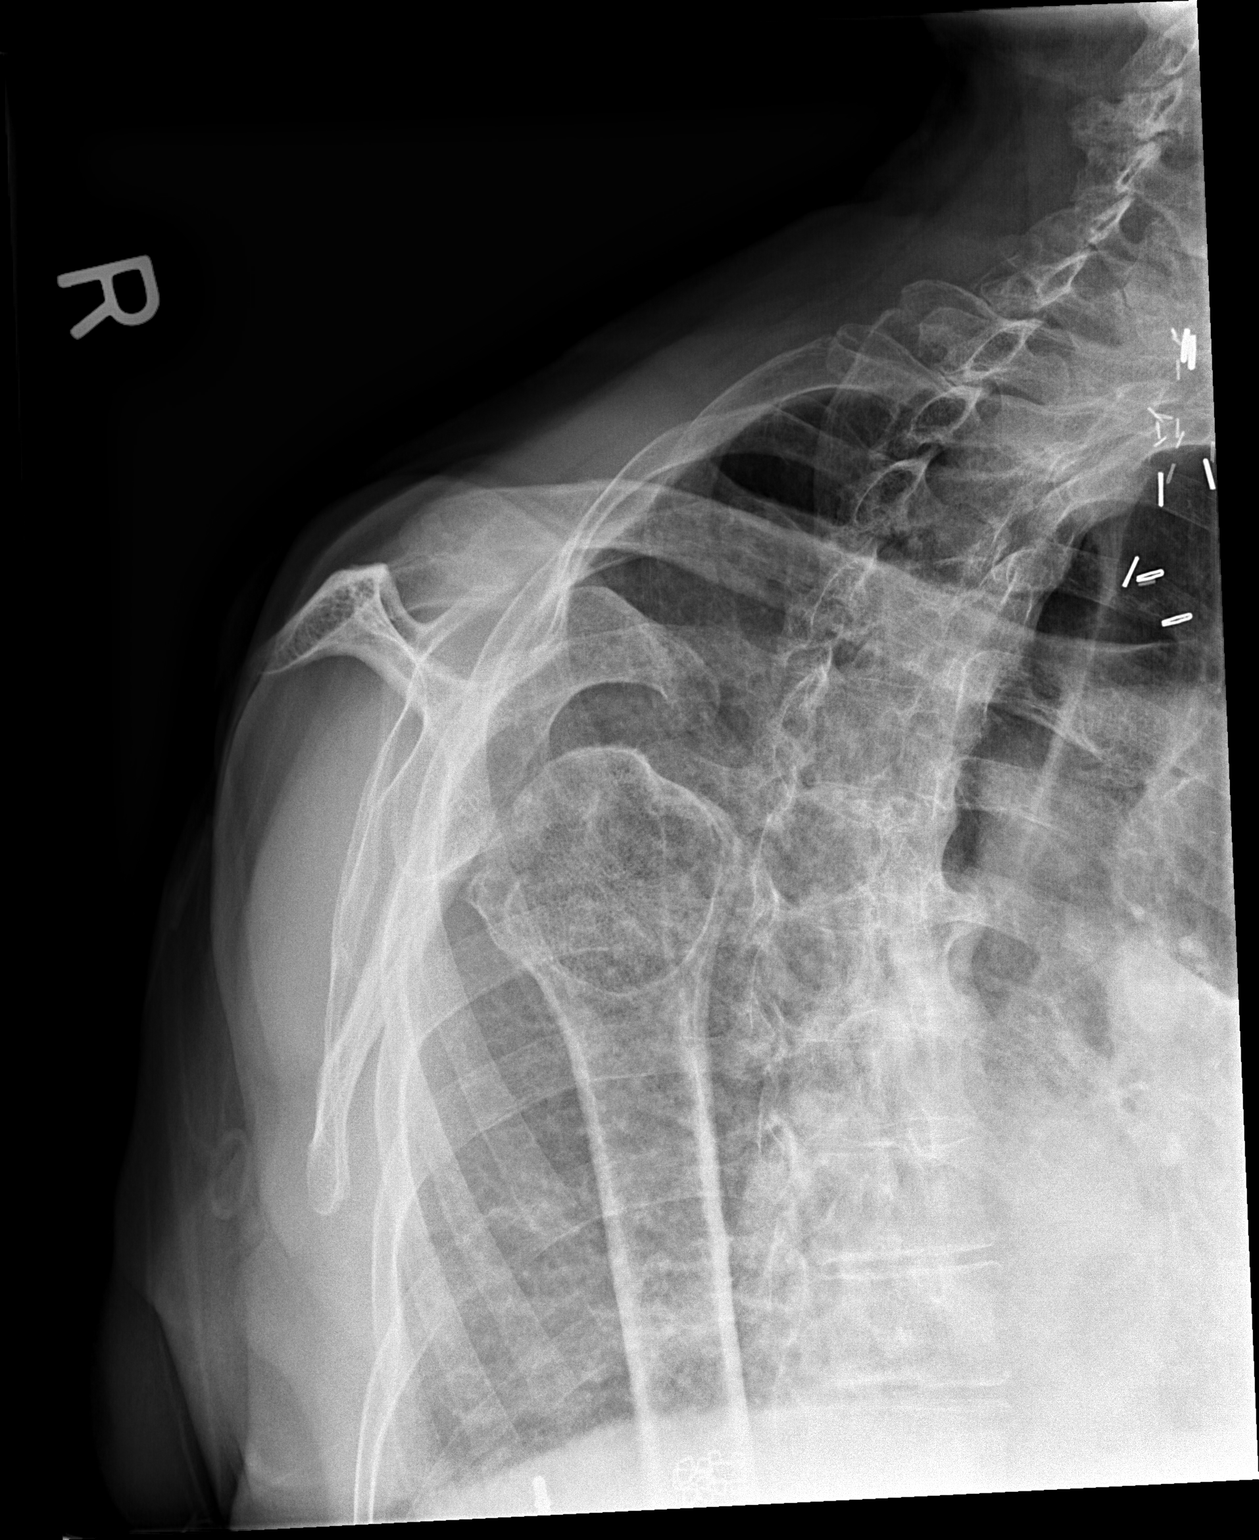

[2 of 2 positions shown; findings below may reference images not displayed]

FINDINGS: Right humeral head dislocated anteriorly and inferiorly relative to
the glenoid. Associated comminuted fracture fragments at the
posterolateral aspect of the right humeral head, likely arising from
the humeral head itself. Glenoid grossly intact. AC joint
approximated. Osteoarthritic changes noted about the right AC joint.
Visualized right hemithorax clear. No appreciable soft tissue
injury.
IMPRESSION: Acute anterior inferior dislocation of the right glenohumeral joint
with associated comminuted fracture.

## 2022-12-30 ENCOUNTER — Ambulatory Visit (INDEPENDENT_AMBULATORY_CARE_PROVIDER_SITE_OTHER): Payer: Medicare Other | Admitting: Otolaryngology

## 2023-01-20 ENCOUNTER — Encounter (INDEPENDENT_AMBULATORY_CARE_PROVIDER_SITE_OTHER): Payer: Self-pay

## 2023-01-20 ENCOUNTER — Ambulatory Visit (INDEPENDENT_AMBULATORY_CARE_PROVIDER_SITE_OTHER): Payer: Medicare Other | Admitting: Otolaryngology

## 2023-01-20 VITALS — Ht 62.0 in | Wt 130.0 lb

## 2023-01-20 DIAGNOSIS — H95123 Granulation of postmastoidectomy cavity, bilateral ears: Secondary | ICD-10-CM | POA: Diagnosis not present

## 2023-01-20 NOTE — Progress Notes (Signed)
Patient ID: Terri Ballard, female   DOB: 22-Feb-1936, 87 y.o.   MRN: [id number]  Procedure: Debridement of the bilateral mastoid cavities  Indication: The patient has a history of bilateral radical mastoidectomy surgeries. She returns today for routine mastoid cavity cleaning. She reports increasing clogging sensation in her ears.  Anesthesia: None  Description: The patient is placed supine on the operating table. Under the operating microscope, the left ear is examined. A mastoid bowl is noted. A significant amount of squamous debris and granulation tissue are noted within the mastoid bowl. Under the operating microscope, the squamous debris and granulation tissue are carefully removed with a combination of suction catheters, cerumen curette, and alligator forceps. After the debridement procedure, the mastoid bowl is noted to be healthy with no evidence of infection or cholesteatoma. The procedure is repeated on the right side with similar findings. The patient tolerated the procedure well.  Follow-up: The patient will return for reevaluation in 12 months.

## 2024-02-02 ENCOUNTER — Encounter (INDEPENDENT_AMBULATORY_CARE_PROVIDER_SITE_OTHER): Payer: Self-pay | Admitting: Otolaryngology

## 2024-02-02 ENCOUNTER — Ambulatory Visit (INDEPENDENT_AMBULATORY_CARE_PROVIDER_SITE_OTHER): Admitting: Otolaryngology

## 2024-02-02 VITALS — BP 150/83 | HR 65 | Temp 97.6°F | Ht 62.0 in | Wt 131.0 lb

## 2024-02-02 DIAGNOSIS — H95123 Granulation of postmastoidectomy cavity, bilateral ears: Secondary | ICD-10-CM | POA: Diagnosis not present

## 2024-02-02 NOTE — Progress Notes (Signed)
 Patient ID: Terri Ballard, female   DOB: 02/06/1936, 88 y.o.   MRN: [id number]  Procedure: Debridement of the bilateral mastoid cavities  Indication: The patient has a history of bilateral radical mastoidectomy surgeries. She returns today for routine mastoid cavity cleaning. She reports recurrent clogging sensation in her ears.  Anesthesia: None  Description: The patient is placed supine on the operating table. Under the operating microscope, the left ear is examined. A mastoid bowl is noted. A significant amount of squamous debris and granulation tissue are noted within the mastoid bowl. Under the operating microscope, the squamous debris and granulation tissue are carefully removed with a combination of suction catheters, cerumen curette, and alligator forceps. After the debridement procedure, the mastoid bowl is noted to be healthy with no evidence of infection or cholesteatoma. The procedure is repeated on the right side with similar findings. The patient tolerated the procedure well.  Follow-up: The patient will return for reevaluation in 12 months.

## 2024-03-18 ENCOUNTER — Emergency Department (HOSPITAL_BASED_OUTPATIENT_CLINIC_OR_DEPARTMENT_OTHER)
Admission: EM | Admit: 2024-03-18 | Discharge: 2024-03-18 | Disposition: A | Discharge status: Home / Self Care | Source: Ambulatory Visit | Attending: Emergency Medicine | Admitting: Emergency Medicine

## 2024-03-18 ENCOUNTER — Emergency Department (HOSPITAL_BASED_OUTPATIENT_CLINIC_OR_DEPARTMENT_OTHER)

## 2024-03-18 ENCOUNTER — Other Ambulatory Visit: Payer: Self-pay

## 2024-03-18 DIAGNOSIS — S060X0A Concussion without loss of consciousness, initial encounter: Secondary | ICD-10-CM | POA: Diagnosis not present

## 2024-03-18 DIAGNOSIS — Z79899 Other long term (current) drug therapy: Secondary | ICD-10-CM | POA: Insufficient documentation

## 2024-03-18 DIAGNOSIS — W19XXXA Unspecified fall, initial encounter: Secondary | ICD-10-CM | POA: Insufficient documentation

## 2024-03-18 DIAGNOSIS — S0990XA Unspecified injury of head, initial encounter: Secondary | ICD-10-CM | POA: Diagnosis present

## 2024-03-18 MED ORDER — ONDANSETRON 4 MG PO TBDP: 4.0000 mg | ORAL_TABLET | Freq: Three times a day (TID) | ORAL | 0 refills | Status: AC | PRN

## 2024-03-18 MED ORDER — ONDANSETRON 4 MG PO TBDP
ORAL_TABLET | ORAL | Status: AC
  Filled 2024-03-18: qty 1

## 2024-03-18 MED ORDER — ONDANSETRON 4 MG PO TBDP: 4.0000 mg | ORAL_TABLET | Freq: Once | ORAL | Status: DC

## 2024-03-18 NOTE — ED Notes (Signed)

## 2024-03-18 NOTE — Discharge Instructions (Addendum)
 Please follow up with primary care. Seek emergency care if experiencing any new or worsening symptoms.

## 2024-03-18 NOTE — ED Provider Triage Note (Signed)
 Emergency Medicine Provider Triage Evaluation Note  Terri Ballard , a 88 y.o. female  was evaluated in triage.  Pt complains of slipped and fell on Saturday. Denies LOC, blood thinners, seizure. Neck is sore. Patient also nauseated. Denies fever, cough, SOB, chest pain, vomiting, diarrhea. Denies pain anywhere else. States that she is able to walk without pain.  Review of Systems  Positive:  Negative:   Physical Exam  There were no vitals taken for this visit. Gen:   Awake, no distress   Resp:  Normal effort  MSK:   Moves extremities without difficulty  Other:    Medical Decision Making  Medically screening exam initiated at 2:18 PM.  Appropriate orders placed.  Arletta Inglett was informed that the remainder of the evaluation will be completed by another provider, this initial triage assessment does not replace that evaluation, and the importance of remaining in the ED until their evaluation is complete.     Hoy Nidia FAL[place], [place] 03/18/24 1421

## 2024-03-18 NOTE — ED Triage Notes (Addendum)
 Slipped Saturday and fell hit back head. No LOC. No thinners. Headache and some neck soreness. +N/-V/-D, afebrile.

## 2024-03-18 NOTE — ED Provider Notes (Signed)
 " [place] EMERGENCY DEPARTMENT AT [facility] Provider Note   CSN: [id number] Arrival date & time: 03/18/24  1346     Patient presents with: Terri Ballard is a 88 y.o. female with PMHx thyroid disease who presents to ED concerned for mechanical fall 2 days ago.  Denies LOC, blood thinners, seizure. States that the occipital area of her head is sore and top of neck is also a little sore. Patient also with intermittent nausea. Denies fever, cough, SOB, chest pain, vomiting, diarrhea. Denies pain anywhere else. States that she is able to walk without pain.     Fall Associated symptoms include headaches.       Prior to Admission medications  Medication Sig Start Date End Date Taking? Authorizing Provider  ondansetron  (ZOFRAN -ODT) 4 MG disintegrating tablet Take 1 tablet (4 mg total) by mouth every 8 (eight) hours as needed for nausea. 03/18/24  Yes Hoy Fraction F, PA-C  Calcium-Magnesium 100-50 MG TABS Take 1 tablet by mouth daily.    [provider]  cholecalciferol (VITAMIN D3) 25 MCG (1000 UT) tablet Take 1,000 Units by mouth daily.    [provider]  hydrochlorothiazide (HYDRODIURIL) 12.5 MG tablet Take 12.5 mg by mouth daily.  04/09/18   [provider]  SYNTHROID 50 MCG tablet Take 50 mcg by mouth every morning. 05/11/14   [provider]    Allergies: Ciprofloxacin, Doxycycline, Keflex [cephalexin], Lisinopril, and Penicillins    Review of Systems  Neurological:  Positive for headaches.    Updated Vital Signs BP (!) 173/96   Pulse 72   Temp 97.7 F (36.5 C) (Oral)   Resp 17   SpO2 100%   Physical Exam Vitals and nursing note reviewed.  Constitutional:      General: She is not in acute distress.    Appearance: She is not ill-appearing or toxic-appearing.  HENT:     Head: Normocephalic and atraumatic.     Mouth/Throat:     Mouth: Mucous membranes are moist.  Eyes:     General: No scleral icterus.        Right eye: No discharge.        Left eye: No discharge.     Conjunctiva/sclera: Conjunctivae normal.  Cardiovascular:     Rate and Rhythm: Normal rate and regular rhythm.     Pulses: Normal pulses.     Heart sounds: Normal heart sounds. No murmur heard. Pulmonary:     Effort: Pulmonary effort is normal. No respiratory distress.     Breath sounds: Normal breath sounds. No wheezing, rhonchi or rales.  Abdominal:     General: Abdomen is flat.     Tenderness: There is no abdominal tenderness.  Musculoskeletal:     Right lower leg: No edema.     Left lower leg: No edema.     Comments: No pain with movement of extremities during neuro exam. No cervical spine midline tenderness, step-offs, or crepitus.  Skin:    General: Skin is warm and dry.     Findings: No rash.  Neurological:     General: No focal deficit present.     Mental Status: She is alert and oriented to person, place, and time. Mental status is at baseline.     Comments: GCS 15. Speech is goal oriented. No deficits appreciated to CN III-XII; symmetric eyebrow raise, no facial drooping, tongue midline. Patient has equal grip strength bilaterally with 5/5 strength against resistance in all major muscle groups bilaterally.  Sensation to light touch intact. Patient moves extremities without ataxia. Patient ambulatory with steady gait.   Psychiatric:        Mood and Affect: Mood normal.        Behavior: Behavior normal.     (all labs ordered are listed, but only abnormal results are displayed) Labs Reviewed - No data to display  EKG: None  Radiology: CT Head Wo Contrast Result Date: 03/18/2024 EXAM: CT HEAD WITHOUT CONTRAST 03/18/2024 02:42:30 PM TECHNIQUE: CT of the head was performed without the administration of intravenous contrast. Automated exposure control, iterative reconstruction, and/or weight based adjustment of the mA/kV was utilized to reduce the radiation dose to as low as reasonably achievable. COMPARISON: None  available. CLINICAL HISTORY: Head trauma, minor (Age >= 65y). FINDINGS: BRAIN AND VENTRICLES: No acute hemorrhage. No evidence of acute infarct. No hydrocephalus. No extra-axial collection. No mass effect or midline shift. Moderate periventricular and subcortical white matter low-density changes compatible with chronic microvascular ischemic change. Mild diffuse cerebral volume loss. Atherosclerotic calcifications in large vessels of skull base. ORBITS: No acute abnormality. Bilateral cataract surgery noted. Left optic disc drusen. SINUSES: Mucosal thickening in the hypoplastic right sphenoid sinus. SOFT TISSUES AND SKULL: No acute soft tissue abnormality. No skull fracture. Sequelae of bilateral canal wall down mastoidectomies. There is soft tissue at the right mastoidectomy site which is unchanged from the 2016 maxillofacial CT. IMPRESSION: 1. No acute intracranial abnormality related to the head trauma. Electronically signed by: Donnice Mania MD 03/18/2024 03:18 PM EST RP Workstation: [id number]   CT Cervical Spine Wo Contrast Result Date: 03/18/2024 EXAM: CT CERVICAL SPINE WITHOUT CONTRAST 03/18/2024 02:42:30 PM TECHNIQUE: CT of the cervical spine was performed without the administration of intravenous contrast. Multiplanar reformatted images are provided for review. Automated exposure control, iterative reconstruction, and/or weight based adjustment of the mA/kV was utilized to reduce the radiation dose to as low as reasonably achievable. COMPARISON: None available. CLINICAL HISTORY: Neck trauma (Age >= 65y) FINDINGS: BONES AND ALIGNMENT: Straightening of the normal cervical lordosis. Trace degenerative retrolisthesis of C5 on C6. No evidence of traumatic malalignment. 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 No acute fracture. DEGENERATIVE CHANGES: Disc space narrowing most pronounced at C5-C6. Degenerative endplate osteophytes at C5-C6 and C6-C7. Disc osteophyte complexes at multiple levels without  high grade osseous spinal canal stenosis. Arthrosis and uncovertebral hypertrophy at multiple levels. Foraminal stenosis most pronounced on the left at C5-C6. SOFT TISSUES: Postsurgical changes of right thyroidectomy with additional surgical clips adjacent to the left thyroid. Focal scarring in the anteromedial right upper lobe. No prevertebral soft tissue swelling. IMPRESSION: 1. No evidence of acute traumatic injury. 2. Degenerative changes as above. Electronically signed by: Donnice Mania MD 03/18/2024 03:14 PM EST RP Workstation: [id number]     Procedures   Medications Ordered in the ED  ondansetron  (ZOFRAN -ODT) disintegrating tablet 4 mg (4 mg Oral Patient Refused/Not Given 03/18/24 1423)  ondansetron  (ZOFRAN -ODT) 4 MG disintegrating tablet (has no administration in time range)                                    Medical Decision Making Amount and/or Complexity of Data Reviewed Radiology: ordered.  Risk Prescription drug management.   This patient presents to the ED after a fall, this involves an extensive number of treatment options, and is a complaint that carries with it a high risk of complications and morbidity.  The differential diagnosis  includes  intracranial hemorrhage, subdural/epidural hematoma, vertebral fracture, spinal cord injury, muscle strain, skull fracture, fracture.   Co morbidities that complicate the patient evaluation  Thyroid disease   Additional history obtained:  Dr. Janey PCP    Problem List / ED Course / Critical interventions / Medication management  Patient presented for Fall. Patient with stable vitals and does not appear to be in distress. Physical/neuro exam reassuring. Patient afebrile with stable vitals. I ordered imaging studies including CT head/cervical spine . I independently visualized and interpreted imaging which showed no acute process. I agree with the radiologist interpretation. Shared all results with patient. Answered all  questions. Recommend following up with PCP. Patient agreeable with plan and is ready to go home. Will prescribe zofran  to help with concussion symptoms.  Staffed with Dr. Pamella. I have reviewed the patients home medicines and have made adjustments as needed The patient has been appropriately medically screened and/or stabilized in the ED. I have low suspicion for any other emergent medical condition which would require further screening, evaluation or treatment in the ED or require inpatient management. At time of discharge the patient is hemodynamically stable and in no acute distress. I have discussed work-up results and diagnosis with patient and answered all questions. Patient is agreeable with discharge plan. We discussed strict return precautions for returning to the emergency department and they verbalized understanding.     Social Determinants of Health:  geriatric        Final diagnoses:  Concussion without loss of consciousness, initial encounter    ED Discharge Orders          Ordered    ondansetron  (ZOFRAN -ODT) 4 MG disintegrating tablet  Every 8 hours PRN        03/18/24 1632               Hoy Nidia [place]AL[place], [place] 03/18/24 1644  "
